# Patient Record
Sex: Female | Born: 1995 | Race: White | Hispanic: No | Marital: Single | Smoking: Never smoker
Health system: Southern US, Community
[De-identification: ages and names within clinical notes are randomized; demographics above are authoritative.]

## PROBLEM LIST (undated history)

## (undated) DIAGNOSIS — K8020 Calculus of gallbladder without cholecystitis without obstruction: Secondary | ICD-10-CM

## (undated) DIAGNOSIS — F419 Anxiety disorder, unspecified: Secondary | ICD-10-CM

## (undated) DIAGNOSIS — F988 Other specified behavioral and emotional disorders with onset usually occurring in childhood and adolescence: Secondary | ICD-10-CM

## (undated) DIAGNOSIS — E119 Type 2 diabetes mellitus without complications: Secondary | ICD-10-CM

## (undated) DIAGNOSIS — F32A Depression, unspecified: Secondary | ICD-10-CM

## (undated) DIAGNOSIS — Z8719 Personal history of other diseases of the digestive system: Secondary | ICD-10-CM

## (undated) DIAGNOSIS — K219 Gastro-esophageal reflux disease without esophagitis: Secondary | ICD-10-CM

## (undated) DIAGNOSIS — F329 Major depressive disorder, single episode, unspecified: Secondary | ICD-10-CM

## (undated) HISTORY — DX: Gastro-esophageal reflux disease without esophagitis: K21.9

---
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
## 2014-01-29 ENCOUNTER — Ambulatory Visit (INDEPENDENT_AMBULATORY_CARE_PROVIDER_SITE_OTHER): Payer: Self-pay | Admitting: General Surgery

## 2014-01-29 NOTE — H&P (Signed)
History of Present Illness Denise Trevino(Denise Heberlein MD; 01/29/2014 4:42 PM) The patient is a 19 year old female who presents for evaluation of gall stones. Patient is a 19 year old female who previously mincing secondary to choledocholithiasis. Patient comes in today secondary to continued abdominal pain. She was recently seen at [facility] with continued right upper quadrant pain after eating [employer] and hot sauce. Patient was previously scheduled but however secondary to monetary issues could not proceed with surgery. Patient does have gallstones on ultrasound   Allergies Denise Trevino(Denise Dollard, RN, BSN; 01/29/2014 4:08 PM) No Known Drug Allergies01/29/2016  Medication History Denise Trevino(Denise Dollard, RN, BSN; 01/29/2014 4:10 PM) Vitamin D (400UNIT Capsule, 1 (one) Oral daily) Active. PriLOSEC (20MG  Capsule DR, Oral daily) Active. Zofran ODT (4MG  Tablet Disperse, Oral as needed) Active. Oxycodone-Acetaminophen (2.5-325MG  Tablet, Oral as needed) Active. Multiple Vitamin (1 (one) Oral daily) Active.  Review of Systems Denise Trevino(Denise Stauber MD; 01/29/2014 4:41 PM) General Present- Appetite Loss, Chills, Fatigue, Night Sweats and Weight Gain. Not Present- Fever and Weight Loss. Skin Not Present- Change in Wart/Mole, Dryness, Hives, Jaundice, New Lesions, Non-Healing Wounds, Rash and Ulcer. HEENT Present- Nose Bleed, Seasonal Allergies, Visual Disturbances and Wears glasses/contact lenses. Not Present- Earache, Hearing Loss, Hoarseness, Oral Ulcers, Ringing in the Ears, Sinus Pain, Sore Throat and Yellow Eyes. Breast Not Present- Breast Mass, Breast Pain, Nipple Discharge and Skin Changes. Cardiovascular Present- Palpitations. Not Present- Chest Pain, Difficulty Breathing Lying Down, Leg Cramps, Rapid Heart Rate, Shortness of Breath and Swelling of Extremities. Gastrointestinal Present- Abdominal Pain, Bloating, Excessive gas, Gets full quickly at meals, Hemorrhoids, Indigestion, Nausea and Vomiting. Not Present-  Bloody Stool, Change in Bowel Habits, Chronic diarrhea, Constipation, Difficulty Swallowing and Rectal Pain. Female Genitourinary Present- Frequency and Urgency. Not Present- Nocturia, Painful Urination and Pelvic Pain. Musculoskeletal Present- Back Pain. Not Present- Joint Pain, Joint Stiffness, Muscle Pain, Muscle Weakness and Swelling of Extremities. Neurological Present- Numbness. Not Present- Decreased Memory, Fainting, Headaches, Seizures, Tingling, Tremor, Trouble walking and Weakness. Psychiatric Present- Depression. Not Present- Anxiety, Bipolar, Change in Sleep Pattern, Fearful and Frequent crying. Endocrine Present- Hot flashes. Not Present- Cold Intolerance, Excessive Hunger, Hair Changes, Heat Intolerance and New Diabetes.   Vitals Glass blower/designer(Denise Dollard RN, BSN; 01/29/2014 4:12 PM) 01/29/2014 4:10 PM Weight: 295 lb Height: 64in Body Surface Area: 2.46 m Body Mass Index: 50.64 kg/m Temp.: 99.38F(Oral)  Pulse: 92 (Regular)  Resp.: 18 (Unlabored)  P.OX: 98% (Room air) BP: 130/90 (Sitting, Left Arm, Standard)    Physical Exam Denise Trevino(Denise Picazo MD; 01/29/2014 4:41 PM) General Mental Status-Alert. General Appearance-Consistent with stated age. Hydration-Well hydrated. Voice-Normal.  Head and Neck Head-normocephalic, atraumatic with no lesions or palpable masses.  Chest and Lung Exam Chest and lung exam reveals -quiet, even and easy respiratory effort with no use of accessory muscles and on auscultation, normal breath sounds, no adventitious sounds and normal vocal resonance. Inspection Chest Wall - Normal. Back - normal.  Cardiovascular Cardiovascular examination reveals -on palpation PMI is normal in location and amplitude, no palpable S3 or S4. Normal cardiac borders., normal heart sounds, regular rate and rhythm with no murmurs, carotid auscultation reveals no bruits and normal pedal pulses bilaterally.  Abdomen Inspection Normal Exam - No Hernias.  Skin - Scar - no surgical scars. Palpation/Percussion Normal exam - Soft, Non Tender, No Rebound tenderness, No Rigidity (guarding) and No hepatosplenomegaly. Auscultation Normal exam - Bowel sounds normal.  Neurologic Neurologic evaluation reveals -alert and oriented x 3 with no impairment of recent or remote memory. Mental Status-Normal.  Musculoskeletal Normal Exam - Left-Upper Extremity Strength Normal and Lower Extremity Strength Normal. Normal Exam - Right-Upper Extremity Strength Normal, Lower Extremity Weakness.    Assessment & Plan Denise Trevino(Denise Vejar MD; 01/29/2014 4:43 PM) SYMPTOMATIC CHOLELITHIASIS (574.20  K80.20) Impression: 19 year old female sent to gallstones  1. Will proceed to the operative for a laparoscopic cholecystectomy 2. Risks and benefits were discussed with the patient to generally include, but not limited to: infection, bleeding, possible need for post op ERCP, damage to the bile ducts, bile leak, and possible need for further surgery. Alternatives were offered and described. All questions were answered and the patient voiced understanding of the procedure and wishes to proceed at this point with a laparoscopic cholecystectomy

## 2014-02-08 ENCOUNTER — Ambulatory Visit (INDEPENDENT_AMBULATORY_CARE_PROVIDER_SITE_OTHER): Payer: Self-pay | Admitting: General Surgery

## 2014-02-15 ENCOUNTER — Inpatient Hospital Stay (HOSPITAL_COMMUNITY): Admission: RE | Admit: 2014-02-15 | Payer: Self-pay | Source: Ambulatory Visit

## 2014-02-22 ENCOUNTER — Encounter (HOSPITAL_COMMUNITY)
Admission: RE | Admit: 2014-02-22 | Discharge: 2014-02-22 | Disposition: A | Discharge status: Home / Self Care | Payer: 59 | Source: Ambulatory Visit | Attending: General Surgery | Admitting: General Surgery

## 2014-02-22 ENCOUNTER — Encounter (HOSPITAL_COMMUNITY): Payer: Self-pay

## 2014-02-22 DIAGNOSIS — Z01812 Encounter for preprocedural laboratory examination: Secondary | ICD-10-CM | POA: Diagnosis present

## 2014-02-22 DIAGNOSIS — K8020 Calculus of gallbladder without cholecystitis without obstruction: Secondary | ICD-10-CM | POA: Diagnosis not present

## 2014-02-22 HISTORY — DX: Type 2 diabetes mellitus without complications: E11.9

## 2014-02-22 HISTORY — DX: Depression, unspecified: F32.A

## 2014-02-22 HISTORY — DX: Major depressive disorder, single episode, unspecified: F32.9

## 2014-02-22 HISTORY — DX: Other specified behavioral and emotional disorders with onset usually occurring in childhood and adolescence: F98.8

## 2014-02-22 HISTORY — DX: Anxiety disorder, unspecified: F41.9

## 2014-02-22 HISTORY — DX: Personal history of other diseases of the digestive system: Z87.19

## 2014-02-22 LAB — BASIC METABOLIC PANEL
ANION GAP: 10 (ref 5–15)
BUN: 10 mg/dL (ref 6–23)
CALCIUM: 9.7 mg/dL (ref 8.4–10.5)
CO2: 25 mmol/L (ref 19–32)
CREATININE: 0.68 mg/dL (ref 0.50–1.10)
Chloride: 105 mmol/L (ref 96–112)
GFR calc Af Amer: >90 mL/min (ref >90)
GFR calc non Af Amer: >90 mL/min (ref >90)
GLUCOSE: 121 mg/dL — AB (ref 70–99)
Potassium: 3.8 mmol/L (ref 3.5–5.1)
SODIUM: 140 mmol/L (ref 135–145)

## 2014-02-22 LAB — CBC
HCT: 41.6 % (ref 36.0–46.0)
HEMOGLOBIN: 14.2 g/dL (ref 12.0–15.0)
MCH: 29 pg (ref 26.0–34.0)
MCHC: 34.1 g/dL (ref 30.0–36.0)
MCV: 85.1 fL (ref 78.0–100.0)
Platelets: 226 10*3/uL (ref 150–400)
RBC: 4.89 MIL/uL (ref 3.87–5.11)
RDW: 13 % (ref 11.5–15.5)
WBC: 5.9 10*3/uL (ref 4.0–10.5)

## 2014-02-22 LAB — HCG, SERUM, QUALITATIVE: Preg, Serum: NEGATIVE

## 2014-02-22 NOTE — Pre-Procedure Instructions (Addendum)
Erskine Speedessa Mehan  02/22/2014   Your procedure is scheduled on:  Wednesday, March 24.                 [facility] at 10:15AM.  Call this number if you have problems the morning of surgery: [phone]                   Remember:   Do not eat food or drink liquids after midnight Tuesday.   Take these medicines the morning of surgery with A SIP OF WATER: Take if needed: acetaminophen (TYLENOL), or oxycodone                Stop taking Vitamins.   Do not wear jewelry, make-up or nail polish.  Do not wear lotions, powders, or perfumes.  Do not shave 48 hours prior to surgery.  Do not bring valuables to the hospital.              [facility] is not responsible for any belongings or valuables.               Contacts, dentures or bridgework may not be worn into surgery.  Leave suitcase in the car. After surgery it may be brought to your room.  For patients admitted to the hospital, discharge time is determined by yourtreatment team.               Patients discharged the day of surgery will not be allowed to drive home.  Name and phone number of your driver:   Special Instructions: Review  [place] - Preparing For Surgery.   Please read over the following fact sheets that you were given: Pain Booklet, Coughing and Deep Breathing and Surgical Site Infection Prevention

## 2014-02-23 MED ORDER — CHLORHEXIDINE GLUCONATE 4 % EX LIQD
1.0000 "application " | Freq: Once | CUTANEOUS | Status: DC
  Filled 2014-02-23: qty 15

## 2014-02-23 MED ORDER — DEXTROSE 5 % IV SOLN
3.0000 g | INTRAVENOUS | Status: AC
  Administered 2014-02-24: 3 g via INTRAVENOUS
  Filled 2014-02-23 (×2): qty 3000

## 2014-02-24 ENCOUNTER — Encounter (HOSPITAL_COMMUNITY): Payer: Self-pay | Admitting: *Deleted

## 2014-02-24 ENCOUNTER — Ambulatory Visit (HOSPITAL_COMMUNITY)
Admission: RE | Admit: 2014-02-24 | Discharge: 2014-02-24 | Disposition: A | Discharge status: Home / Self Care | Payer: 59 | Source: Ambulatory Visit | Attending: General Surgery | Admitting: General Surgery

## 2014-02-24 ENCOUNTER — Ambulatory Visit (HOSPITAL_COMMUNITY): Payer: 59 | Admitting: Anesthesiology

## 2014-02-24 ENCOUNTER — Encounter (HOSPITAL_COMMUNITY)
Admission: RE | Disposition: A | Discharge status: Home / Self Care | Payer: Self-pay | Source: Ambulatory Visit | Attending: General Surgery

## 2014-02-24 DIAGNOSIS — K8020 Calculus of gallbladder without cholecystitis without obstruction: Secondary | ICD-10-CM | POA: Diagnosis present

## 2014-02-24 DIAGNOSIS — K219 Gastro-esophageal reflux disease without esophagitis: Secondary | ICD-10-CM | POA: Diagnosis not present

## 2014-02-24 DIAGNOSIS — K449 Diaphragmatic hernia without obstruction or gangrene: Secondary | ICD-10-CM | POA: Insufficient documentation

## 2014-02-24 DIAGNOSIS — K8010 Calculus of gallbladder with chronic cholecystitis without obstruction: Secondary | ICD-10-CM | POA: Diagnosis not present

## 2014-02-24 DIAGNOSIS — E119 Type 2 diabetes mellitus without complications: Secondary | ICD-10-CM | POA: Diagnosis not present

## 2014-02-24 HISTORY — PX: CHOLECYSTECTOMY: SHX55

## 2014-02-24 LAB — GLUCOSE, CAPILLARY: Glucose-Capillary: 97 mg/dL (ref 70–99)

## 2014-02-24 SURGERY — LAPAROSCOPIC CHOLECYSTECTOMY
Anesthesia: General | Site: Abdomen

## 2014-02-24 MED ORDER — SODIUM CHLORIDE 0.9 % IR SOLN
Status: DC | PRN
  Administered 2014-02-24: 1000 mL

## 2014-02-24 MED ORDER — BUPIVACAINE HCL 0.25 % IJ SOLN
INTRAMUSCULAR | Status: DC | PRN
  Administered 2014-02-24: 8 mL

## 2014-02-24 MED ORDER — GLYCOPYRROLATE 0.2 MG/ML IJ SOLN
INTRAMUSCULAR | Status: AC
  Filled 2014-02-24: qty 2

## 2014-02-24 MED ORDER — ROCURONIUM BROMIDE 50 MG/5ML IV SOLN
INTRAVENOUS | Status: AC
  Filled 2014-02-24: qty 1

## 2014-02-24 MED ORDER — ARTIFICIAL TEARS OP OINT
TOPICAL_OINTMENT | OPHTHALMIC | Status: AC
  Filled 2014-02-24: qty 3.5

## 2014-02-24 MED ORDER — ACETAMINOPHEN 10 MG/ML IV SOLN
INTRAVENOUS | Status: DC | PRN
  Administered 2014-02-24: 1000 mg via INTRAVENOUS

## 2014-02-24 MED ORDER — ONDANSETRON HCL 4 MG/2ML IJ SOLN
INTRAMUSCULAR | Status: DC | PRN
  Administered 2014-02-24: 4 mg via INTRAVENOUS

## 2014-02-24 MED ORDER — FENTANYL CITRATE 0.05 MG/ML IJ SOLN
INTRAMUSCULAR | Status: AC
  Filled 2014-02-24: qty 2

## 2014-02-24 MED ORDER — ACETAMINOPHEN 160 MG/5ML PO SOLN
325.0000 mg | ORAL | Status: DC | PRN
  Filled 2014-02-24: qty 20.3

## 2014-02-24 MED ORDER — OXYCODONE HCL 5 MG PO TABS: 5.0000 mg | ORAL_TABLET | ORAL | Status: DC | PRN

## 2014-02-24 MED ORDER — ACETAMINOPHEN 325 MG PO TABS
650.0000 mg | ORAL_TABLET | ORAL | Status: DC | PRN
  Filled 2014-02-24: qty 2

## 2014-02-24 MED ORDER — ROCURONIUM BROMIDE 100 MG/10ML IV SOLN
INTRAVENOUS | Status: DC | PRN
  Administered 2014-02-24: 40 mg via INTRAVENOUS

## 2014-02-24 MED ORDER — ONDANSETRON HCL 4 MG/2ML IJ SOLN
INTRAMUSCULAR | Status: AC
  Filled 2014-02-24: qty 2

## 2014-02-24 MED ORDER — FENTANYL CITRATE 0.05 MG/ML IJ SOLN
INTRAMUSCULAR | Status: AC
  Filled 2014-02-24: qty 5

## 2014-02-24 MED ORDER — ACETAMINOPHEN 650 MG RE SUPP
650.0000 mg | RECTAL | Status: DC | PRN
  Filled 2014-02-24: qty 1

## 2014-02-24 MED ORDER — FENTANYL CITRATE 0.05 MG/ML IJ SOLN
INTRAMUSCULAR | Status: DC | PRN
  Administered 2014-02-24: 100 ug via INTRAVENOUS
  Administered 2014-02-24 (×3): 50 ug via INTRAVENOUS

## 2014-02-24 MED ORDER — GLYCOPYRROLATE 0.2 MG/ML IJ SOLN
INTRAMUSCULAR | Status: AC
  Filled 2014-02-24: qty 5

## 2014-02-24 MED ORDER — GLYCOPYRROLATE 0.2 MG/ML IJ SOLN
INTRAMUSCULAR | Status: DC | PRN
  Administered 2014-02-24: .8 mg via INTRAVENOUS

## 2014-02-24 MED ORDER — ACETAMINOPHEN 10 MG/ML IV SOLN
INTRAVENOUS | Status: AC
  Filled 2014-02-24: qty 100

## 2014-02-24 MED ORDER — NEOSTIGMINE METHYLSULFATE 10 MG/10ML IV SOLN
INTRAVENOUS | Status: DC | PRN
  Administered 2014-02-24: 5 mg via INTRAVENOUS

## 2014-02-24 MED ORDER — NEOSTIGMINE METHYLSULFATE 10 MG/10ML IV SOLN
INTRAVENOUS | Status: AC
  Filled 2014-02-24: qty 1

## 2014-02-24 MED ORDER — PROPOFOL 10 MG/ML IV BOLUS
INTRAVENOUS | Status: AC
  Filled 2014-02-24: qty 20

## 2014-02-24 MED ORDER — DEXAMETHASONE SODIUM PHOSPHATE 4 MG/ML IJ SOLN
INTRAMUSCULAR | Status: DC | PRN
Start: 2014-02-24 — End: 2014-02-24
  Administered 2014-02-24: 8 mg via INTRAVENOUS

## 2014-02-24 MED ORDER — LACTATED RINGERS IV SOLN
INTRAVENOUS | Status: DC | PRN
  Administered 2014-02-24 (×2): via INTRAVENOUS

## 2014-02-24 MED ORDER — ACETAMINOPHEN 325 MG PO TABS: 325.0000 mg | ORAL_TABLET | ORAL | Status: DC | PRN

## 2014-02-24 MED ORDER — 0.9 % SODIUM CHLORIDE (POUR BTL) OPTIME
TOPICAL | Status: DC | PRN
  Administered 2014-02-24: 1000 mL

## 2014-02-24 MED ORDER — MIDAZOLAM HCL 2 MG/2ML IJ SOLN
INTRAMUSCULAR | Status: AC
  Filled 2014-02-24: qty 2

## 2014-02-24 MED ORDER — LIDOCAINE HCL (CARDIAC) 20 MG/ML IV SOLN
INTRAVENOUS | Status: AC
  Filled 2014-02-24: qty 5

## 2014-02-24 MED ORDER — LIDOCAINE HCL (CARDIAC) 20 MG/ML IV SOLN
INTRAVENOUS | Status: DC | PRN
  Administered 2014-02-24: 100 mg via INTRAVENOUS

## 2014-02-24 MED ORDER — OXYCODONE HCL 5 MG/5ML PO SOLN
5.0000 mg | Freq: Once | ORAL | Status: AC | PRN
Start: 2014-02-24 — End: 2014-02-24

## 2014-02-24 MED ORDER — OXYCODONE-ACETAMINOPHEN 7.5-325 MG PO TABS
1.0000 | ORAL_TABLET | ORAL | Status: AC | PRN
Start: 2014-02-24 — End: ?

## 2014-02-24 MED ORDER — LACTATED RINGERS IV SOLN
INTRAVENOUS | Status: DC
  Administered 2014-02-24: 11:00:00 via INTRAVENOUS

## 2014-02-24 MED ORDER — MIDAZOLAM HCL 5 MG/5ML IJ SOLN
INTRAMUSCULAR | Status: DC | PRN
  Administered 2014-02-24: 2 mg via INTRAVENOUS

## 2014-02-24 MED ORDER — PROPOFOL 10 MG/ML IV BOLUS
INTRAVENOUS | Status: DC | PRN
  Administered 2014-02-24: 200 mg via INTRAVENOUS

## 2014-02-24 MED ORDER — OXYCODONE HCL 5 MG PO TABS
5.0000 mg | ORAL_TABLET | Freq: Once | ORAL | Status: AC | PRN
  Administered 2014-02-24: 5 mg via ORAL

## 2014-02-24 MED ORDER — FENTANYL CITRATE 0.05 MG/ML IJ SOLN
25.0000 ug | INTRAMUSCULAR | Status: DC | PRN
  Administered 2014-02-24 (×3): 50 ug via INTRAVENOUS

## 2014-02-24 SURGICAL SUPPLY — 51 items
APL SKNCLS STERI-STRIP NONHPOA (GAUZE/BANDAGES/DRESSINGS) ×1
BAG SPEC RTRVL LRG 6X4 10 (ENDOMECHANICALS)
BENZOIN TINCTURE PRP APPL 2/3 (GAUZE/BANDAGES/DRESSINGS) ×3 IMPLANT
CANISTER SUCTION 2500CC (MISCELLANEOUS) ×3 IMPLANT
CHLORAPREP W/TINT 26ML (MISCELLANEOUS) ×3 IMPLANT
CLIP LIGATING HEMO O LOK GREEN (MISCELLANEOUS) ×3 IMPLANT
CLOSURE WOUND 1/2 X4 (GAUZE/BANDAGES/DRESSINGS) ×1
COVER MAYO STAND STRL (DRAPES) IMPLANT
COVER SURGICAL LIGHT HANDLE (MISCELLANEOUS) ×3 IMPLANT
COVER TRANSDUCER ULTRASND (DRAPES) ×3 IMPLANT
DEVICE TROCAR PUNCTURE CLOSURE (ENDOMECHANICALS) ×3 IMPLANT
DRAPE C-ARM 42X72 X-RAY (DRAPES) IMPLANT
DRAPE LAPAROSCOPIC ABDOMINAL (DRAPES) ×3 IMPLANT
ELECT REM PT RETURN 9FT ADLT (ELECTROSURGICAL) ×3
ELECTRODE REM PT RTRN 9FT ADLT (ELECTROSURGICAL) ×1 IMPLANT
GAUZE SPONGE 2X2 8PLY STRL LF (GAUZE/BANDAGES/DRESSINGS) ×1 IMPLANT
GLOVE BIO SURGEON STRL SZ7 (GLOVE) ×4 IMPLANT
GLOVE BIO SURGEON STRL SZ7.5 (GLOVE) ×7 IMPLANT
GLOVE BIOGEL PI IND STRL 6.5 (GLOVE) IMPLANT
GLOVE BIOGEL PI IND STRL 7.0 (GLOVE) IMPLANT
GLOVE BIOGEL PI IND STRL 7.5 (GLOVE) IMPLANT
GLOVE BIOGEL PI INDICATOR 6.5 (GLOVE) ×2
GLOVE BIOGEL PI INDICATOR 7.0 (GLOVE) ×2
GLOVE BIOGEL PI INDICATOR 7.5 (GLOVE) ×2
GOWN STRL REUS W/ TWL LRG LVL3 (GOWN DISPOSABLE) ×2 IMPLANT
GOWN STRL REUS W/ TWL XL LVL3 (GOWN DISPOSABLE) ×1 IMPLANT
GOWN STRL REUS W/TWL LRG LVL3 (GOWN DISPOSABLE) ×6
GOWN STRL REUS W/TWL XL LVL3 (GOWN DISPOSABLE) ×3
IV CATH 14GX2 1/4 (CATHETERS) IMPLANT
KIT BASIN OR (CUSTOM PROCEDURE TRAY) ×3 IMPLANT
KIT ROOM TURNOVER OR (KITS) ×3 IMPLANT
NDL INSUFFLATION 14GA 120MM (NEEDLE) ×1 IMPLANT
NEEDLE INSUFFLATION 14GA 120MM (NEEDLE) ×3 IMPLANT
NS IRRIG 1000ML POUR BTL (IV SOLUTION) ×3 IMPLANT
PAD ARMBOARD 7.5X6 YLW CONV (MISCELLANEOUS) ×6 IMPLANT
POUCH SPECIMEN RETRIEVAL 10MM (ENDOMECHANICALS) IMPLANT
SCISSORS LAP 5X35 DISP (ENDOMECHANICALS) ×3 IMPLANT
SET CHOLANGIOGRAPHY FRANKLIN (SET/KITS/TRAYS/PACK) IMPLANT
SET IRRIG TUBING LAPAROSCOPIC (IRRIGATION / IRRIGATOR) ×3 IMPLANT
SLEEVE ENDOPATH XCEL 5M (ENDOMECHANICALS) ×3 IMPLANT
SPECIMEN JAR SMALL (MISCELLANEOUS) ×3 IMPLANT
SPONGE GAUZE 2X2 STER 10/PKG (GAUZE/BANDAGES/DRESSINGS) ×2
STRIP CLOSURE SKIN 1/2X4 (GAUZE/BANDAGES/DRESSINGS) ×1 IMPLANT
SUT MNCRL AB 3-0 PS2 18 (SUTURE) ×3 IMPLANT
TAPE CLOTH SOFT 2X10 (GAUZE/BANDAGES/DRESSINGS) ×2 IMPLANT
TOWEL OR 17X24 6PK STRL BLUE (TOWEL DISPOSABLE) ×1 IMPLANT
TOWEL OR 17X26 10 PK STRL BLUE (TOWEL DISPOSABLE) ×3 IMPLANT
TRAY LAPAROSCOPIC (CUSTOM PROCEDURE TRAY) ×3 IMPLANT
TROCAR XCEL NON-BLD 11X100MML (ENDOMECHANICALS) ×3 IMPLANT
TROCAR XCEL NON-BLD 5MMX100MML (ENDOMECHANICALS) ×3 IMPLANT
TUBING INSUFFLATION (TUBING) ×3 IMPLANT

## 2014-02-24 NOTE — H&P (View-Only) (Signed)
History of Present Illness Denise Trevino(Denise Kitchings MD; 01/29/2014 4:42 PM) The patient is a 19 year old female who presents for evaluation of gall stones. Patient is a 19 year old female who previously mincing secondary to choledocholithiasis. Patient comes in today secondary to continued abdominal pain. She was recently seen at [facility] with continued right upper quadrant pain after eating [employer] and hot sauce. Patient was previously scheduled but however secondary to monetary issues could not proceed with surgery. Patient does have gallstones on ultrasound   Allergies [name](Denise Dollard, RN, BSN; 01/29/2014 4:08 PM) No Known Drug Allergies01/29/2016  Medication History [name](Denise Dollard, RN, BSN; 01/29/2014 4:10 PM) Vitamin D (400UNIT Capsule, 1 (one) Oral daily) Active. PriLOSEC (20MG  Capsule DR, Oral daily) Active. Zofran ODT (4MG  Tablet Disperse, Oral as needed) Active. Oxycodone-Acetaminophen (2.5-325MG  Tablet, Oral as needed) Active. Multiple Vitamin (1 (one) Oral daily) Active.  Review of Systems Denise Trevino(Denise Redlich MD; 01/29/2014 4:41 PM) General Present- Appetite Loss, Chills, Fatigue, Night Sweats and Weight Gain. Not Present- Fever and Weight Loss. Skin Not Present- Change in Wart/Mole, Dryness, Hives, Jaundice, New Lesions, Non-Healing Wounds, Rash and Ulcer. HEENT Present- Nose Bleed, Seasonal Allergies, Visual Disturbances and Wears glasses/contact lenses. Not Present- Earache, Hearing Loss, Hoarseness, Oral Ulcers, Ringing in the Ears, Sinus Pain, Sore Throat and Yellow Eyes. Breast Not Present- Breast Mass, Breast Pain, Nipple Discharge and Skin Changes. Cardiovascular Present- Palpitations. Not Present- Chest Pain, Difficulty Breathing Lying Down, Leg Cramps, Rapid Heart Rate, Shortness of Breath and Swelling of Extremities. Gastrointestinal Present- Abdominal Pain, Bloating, Excessive gas, Gets full quickly at meals, Hemorrhoids, Indigestion, Nausea and Vomiting. Not Present-  Bloody Stool, Change in Bowel Habits, Chronic diarrhea, Constipation, Difficulty Swallowing and Rectal Pain. Female Genitourinary Present- Frequency and Urgency. Not Present- Nocturia, Painful Urination and Pelvic Pain. Musculoskeletal Present- Back Pain. Not Present- Joint Pain, Joint Stiffness, Muscle Pain, Muscle Weakness and Swelling of Extremities. Neurological Present- Numbness. Not Present- Decreased Memory, Fainting, Headaches, Seizures, Tingling, Tremor, Trouble walking and Weakness. Psychiatric Present- Depression. Not Present- Anxiety, Bipolar, Change in Sleep Pattern, Fearful and Frequent crying. Endocrine Present- Hot flashes. Not Present- Cold Intolerance, Excessive Hunger, Hair Changes, Heat Intolerance and New Diabetes.   Vitals Glass blower/designer(Denise Dollard RN, BSN; 01/29/2014 4:12 PM) 01/29/2014 4:10 PM Weight: 295 lb Height: 64in Body Surface Area: 2.46 m Body Mass Index: 50.64 kg/m Temp.: 99.38F(Oral)  Pulse: 92 (Regular)  Resp.: 18 (Unlabored)  P.OX: 98% (Room air) BP: 130/90 (Sitting, Left Arm, Standard)    Physical Exam Denise Trevino([name] MD; 01/29/2014 4:41 PM) General Mental Status-Alert. General Appearance-Consistent with stated age. Hydration-Well hydrated. Voice-Normal.  Head and Neck Head-normocephalic, atraumatic with no lesions or palpable masses.  Chest and Lung Exam Chest and lung exam reveals -quiet, even and easy respiratory effort with no use of accessory muscles and on auscultation, normal breath sounds, no adventitious sounds and normal vocal resonance. Inspection Chest Wall - Normal. Back - normal.  Cardiovascular Cardiovascular examination reveals -on palpation PMI is normal in location and amplitude, no palpable S3 or S4. Normal cardiac borders., normal heart sounds, regular rate and rhythm with no murmurs, carotid auscultation reveals no bruits and normal pedal pulses bilaterally.  Abdomen Inspection Normal Exam - No Hernias.  Skin - Scar - no surgical scars. Palpation/Percussion Normal exam - Soft, Non Tender, No Rebound tenderness, No Rigidity (guarding) and No hepatosplenomegaly. Auscultation Normal exam - Bowel sounds normal.  Neurologic Neurologic evaluation reveals -alert and oriented x 3 with no impairment of recent or remote memory. Mental Status-Normal.  Musculoskeletal Normal Exam - Left-Upper Extremity Strength Normal and Lower Extremity Strength Normal. Normal Exam - Right-Upper Extremity Strength Normal, Lower Extremity Weakness.    Assessment & Plan Denise Trevino(Denise Kray MD; 01/29/2014 4:43 PM) SYMPTOMATIC CHOLELITHIASIS (574.20  K80.20) Impression: 19 year old female sent to gallstones  1. Will proceed to the operative for a laparoscopic cholecystectomy 2. Risks and benefits were discussed with the patient to generally include, but not limited to: infection, bleeding, possible need for post op ERCP, damage to the bile ducts, bile leak, and possible need for further surgery. Alternatives were offered and described. All questions were answered and the patient voiced understanding of the procedure and wishes to proceed at this point with a laparoscopic cholecystectomy

## 2014-02-24 NOTE — Anesthesia Preprocedure Evaluation (Signed)
Anesthesia Evaluation  Patient identified by MRN, date of birth, ID band Patient awake    Reviewed: Allergy & Precautions, NPO status , Patient's Chart, lab work & pertinent test results  History of Anesthesia Complications Negative for: history of anesthetic complications  Airway Mallampati: II  TM Distance: >3 FB Neck ROM: Full    Dental  (+) Teeth Intact   Pulmonary neg pulmonary ROS,  breath sounds clear to auscultation        Cardiovascular negative cardio ROS  Rhythm:Regular     Neuro/Psych PSYCHIATRIC DISORDERS Anxiety Depression negative neurological ROS     GI/Hepatic Neg liver ROS, hiatal hernia, GERD-  ,  Endo/Other  diabetesMorbid obesity  Renal/GU negative Renal ROS     Musculoskeletal   Abdominal   Peds  Hematology negative hematology ROS (+)   Anesthesia Other Findings   Reproductive/Obstetrics                             Anesthesia Physical Anesthesia Plan  ASA: II  Anesthesia Plan: General   Post-op Pain Management:    Induction: Intravenous  Airway Management Planned: Oral ETT  Additional Equipment: None  Intra-op Plan:   Post-operative Plan: Extubation in OR  Informed Consent: I have reviewed the patients History and Physical, chart, labs and discussed the procedure including the risks, benefits and alternatives for the proposed anesthesia with the patient or authorized representative who has indicated his/her understanding and acceptance.   Dental advisory given  Plan Discussed with: CRNA and Surgeon  Anesthesia Plan Comments:         Anesthesia Quick Evaluation

## 2014-02-24 NOTE — Transfer of Care (Signed)
Immediate Anesthesia Transfer of Care Note  Patient: Denise Trevino  Procedure(s) Performed: Procedure(s): LAPAROSCOPIC CHOLECYSTECTOMY (N/A)  Patient Location: PACU  Anesthesia Type:General  Level of Consciousness: awake, alert , oriented and patient cooperative  Airway & Oxygen Therapy: Patient Spontanous Breathing  Post-op Assessment: Report given to RN and Post -op Vital signs reviewed and stable  Post vital signs: Reviewed and stable  Last Vitals:  Filed Vitals:   02/24/14 1249  BP:   Pulse:   Temp: 36.9 C  Resp:     Complications: No apparent anesthesia complications

## 2014-02-24 NOTE — Anesthesia Postprocedure Evaluation (Signed)
  Anesthesia Post-op Note  Patient: Denise Trevino  Procedure(s) Performed: Procedure(s): LAPAROSCOPIC CHOLECYSTECTOMY (N/A)  Patient Location: PACU  Anesthesia Type:General  Level of Consciousness: awake  Airway and Oxygen Therapy: Patient Spontanous Breathing  Post-op Pain: mild  Post-op Assessment: Post-op Vital signs reviewed, Patient's Cardiovascular Status Stable, Respiratory Function Stable, Patent Airway, No signs of Nausea or vomiting and Pain level controlled  Post-op Vital Signs: Reviewed and stable  Last Vitals:  Filed Vitals:   02/24/14 1526  BP:   Pulse: 63  Temp:   Resp: 15    Complications: No apparent anesthesia complications

## 2014-02-24 NOTE — Progress Notes (Signed)
Report to Maryfrances BunnellLauren Reynolds RN as primary caregiver

## 2014-02-24 NOTE — Interval H&P Note (Signed)
History and Physical Interval Note:  02/24/2014 11:28 AM  Denise Trevino  has presented today for surgery, with the diagnosis of gallstones  The various methods of treatment have been discussed with the patient and family. After consideration of risks, benefits and other options for treatment, the patient has consented to  Procedure(s): LAPAROSCOPIC CHOLECYSTECTOMY (N/A) as a surgical intervention .  The patient's history has been reviewed, patient examined, no change in status, stable for surgery.  I have reviewed the patient's chart and labs.  Questions were answered to the patient's satisfaction.     Marigene Ehlersamirez Jr., Jed LimerickArmando

## 2014-02-24 NOTE — Op Note (Signed)
02/24/2014  12:34 PM  PATIENT:  Denise Trevino  19 y.o. female  PRE-OPERATIVE DIAGNOSIS:  GALLSTONES  POST-OPERATIVE DIAGNOSIS:  GALLSTONES  PROCEDURE:  Procedure(s): LAPAROSCOPIC CHOLECYSTECTOMY (N/A)  SURGEON:  Surgeon(s) and Role:    * Axel FillerArmando Suly Vukelich, MD - Primary  ASSISTANTS: Shirley FriarShelia Bowman, RNFA   ANESTHESIA:   local and general  EBL:  Total I/O In: 1000 [I.V.:1000] Out: -   BLOOD ADMINISTERED:none  DRAINS: none   LOCAL MEDICATIONS USED:  BUPIVICAINE   SPECIMEN:  Source of Specimen:  gallbladder  DISPOSITION OF SPECIMEN:  PATHOLOGY  COUNTS:  YES  TOURNIQUET:  * No tourniquets in log *  DICTATION: .Dragon Dictation  Findings:Chronically inflammed gallbladder and gallstones  Indications for procedure: Pt is a 19 y/o F with RUQ pain and seen to have gallstones.   Details of the procedure: The patient was taken to the operating and placed in the supine position with bilateral SCDs in place. A time out was called and all facts were verified. A pneumoperitoneum was obtained via A Veress needle technique to a pressure of 14mm of mercury. A 5mm trochar was then placed in the right upper quadrant under visualization, and there were no injuries to any abdominal organs. A 11 mm port was then placed in the umbilical region after infiltrating with local anesthesia under direct visualization. A second epigastric port was placed under direct visualization. The gallbladder was identified and retracted, the peritoneum was then sharply dissected from the gallbladder and this dissection was carried down to Calot's triangle. The cystic duct was identified and stripped away circumferentially and seen going into the gallbladder 360, the critical angle was obtained. It was noted to be very dilated and large. A Cook catheter was used to perform an intraoperative cholangiogram. The cystic duct and common bile duct were seen free of filling defects. 2 clips were placed proximally one distally  and the cystic duct transected. The cystic artery was identified and 2 clips placed proximally and one distally and transected. We then proceeded to remove the gallbladder off the hepatic fossa with Bovie cautery. A retrieval bag was then placed in the abdomen and gallbladder placed in the bag. The hepatic fossa was then reexamined and hemostasis was achieved with Bovie cautery and was excellent at this portion of the case. The subhepatic fossa and perihepatic fossa was then irrigated until the effluent was clear. The bag and specimen were extracted from the body.  The 11 mm trocar fascia was reapproximated with the Endo Close #1 Vicryl x2. The pneumoperitoneum was evacuated and all trochars removed under direct visulalization. The skin was then closed with 4-0 Monocryl and the skin dressed with Steri-Strips, gauze, and tape. The patient was awaken from general anesthesia and taken to the recovery room in stable condition.    PLAN OF CARE: Discharge to home after PACU  PATIENT DISPOSITION:  PACU - hemodynamically stable.   Delay start of Pharmacological VTE agent (>24hrs) due to surgical blood loss or risk of bleeding: not applicable

## 2014-02-24 NOTE — Discharge Instructions (Signed)
[facility] ______[facility] °LAPAROSCOPIC SURGERY: POST OP INSTRUCTIONS °Always review your discharge instruction sheet given to you by the facility where your surgery was performed. °IF YOU HAVE DISABILITY OR FAMILY LEAVE FORMS, YOU MUST BRING THEM TO THE OFFICE FOR PROCESSING.   °DO NOT GIVE THEM TO YOUR DOCTOR. ° °1. A prescription for pain medication may be given to you upon discharge.  Take your pain medication as prescribed, if needed.  If narcotic pain medicine is not needed, then you may take acetaminophen (Tylenol) or ibuprofen (Advil) as needed. °2. Take your usually prescribed medications unless otherwise directed. °3. If you need a refill on your pain medication, please contact your pharmacy.  They will contact our office to request authorization. Prescriptions will not be filled after 5pm or on week-ends. °4. You should follow a light diet the first few days after arrival home, such as soup and crackers, etc.  Be sure to include lots of fluids daily. °5. Most patients will experience some swelling and bruising in the area of the incisions.  Ice packs will help.  Swelling and bruising can take several days to resolve.  °6. It is common to experience some constipation if taking pain medication after surgery.  Increasing fluid intake and taking a stool softener (such as Colace) will usually help or prevent this problem from occurring.  A mild laxative (Milk of Magnesia or Miralax) should be taken according to package instructions if there are no bowel movements after 48 hours. °7. Unless discharge instructions indicate otherwise, you may remove your bandages 24-48 hours after surgery, and you may shower at that time.  You may have steri-strips (small skin tapes) in place directly over the incision.  These strips should be left on the skin for 7-10 days.  If your surgeon used skin glue on the incision, you may shower in 24 hours.  The glue will flake off over the next 2-3 weeks.  Any sutures or  staples will be removed at the office during your follow-up visit. °8. ACTIVITIES:  You may resume regular (light) daily activities beginning the next day--such as daily self-care, walking, climbing stairs--gradually increasing activities as tolerated.  You may have sexual intercourse when it is comfortable.  Refrain from any heavy lifting or straining until approved by your doctor. °a. You may drive when you are no longer taking prescription pain medication, you can comfortably wear a seatbelt, and you can safely maneuver your car and apply brakes. °b. RETURN TO WORK:  __________________________________________________________ °9. You should see your doctor in the office for a follow-up appointment approximately 2-3 weeks after your surgery.  Make sure that you call for this appointment within a day or two after you arrive home to insure a convenient appointment time. °10. OTHER INSTRUCTIONS: __________________________________________________________________________________________________________________________ __________________________________________________________________________________________________________________________ °WHEN TO CALL YOUR DOCTOR: °1. Fever over 101.0 °2. Inability to urinate °3. Continued bleeding from incision. °4. Increased pain, redness, or drainage from the incision. °5. Increasing abdominal pain ° °The clinic staff is available to answer your questions during regular business hours.  Please don’t hesitate to call and ask to speak to one of the nurses for clinical concerns.  If you have a medical emergency, go to the nearest emergency room or call 911.  A surgeon from [facility] is always on call at the hospital. °[address], [address], [place], [place]  [address] ? [address] [address], [place], [place]   [address] °[phone] ? [phone] ? FAX [phone] °Web site:   [email or web address] ° °General Anesthesia, Adult, Care After  °Refer to this  sheet in the next few weeks. These instructions provide you with information on caring for yourself after your procedure. Your health care provider may also give you more specific instructions. Your treatment has been planned according to current medical practices, but problems sometimes occur. Call your health care provider if you have any problems or questions after your procedure.  °WHAT TO EXPECT AFTER THE PROCEDURE  °After the procedure, it is typical to experience:  °Sleepiness.  °Nausea and vomiting. °HOME CARE INSTRUCTIONS  °For the first 24 hours after general anesthesia:  °Have a responsible person with you.  °Do not drive a car. If you are alone, do not take public transportation.  °Do not drink alcohol.  °Do not take medicine that has not been prescribed by your health care provider.  °Do not sign important papers or make important decisions.  °You may resume a normal diet and activities as directed by your health care provider.  °Change bandages (dressings) as directed.  °If you have questions or problems that seem related to general anesthesia, call the hospital and ask for the anesthetist or anesthesiologist on call. °SEEK MEDICAL CARE IF:  °You have nausea and vomiting that continue the day after anesthesia.  °You develop a rash. °SEEK IMMEDIATE MEDICAL CARE IF:  °You have difficulty breathing.  °You have chest pain.  °You have any allergic problems. °Document Released: 03/26/2000 Document Revised: 08/20/2012 Document Reviewed: 07/03/2012  °ExitCare® Patient Information ©2014 [employer].  ° ° °

## 2014-02-25 ENCOUNTER — Encounter (HOSPITAL_COMMUNITY): Payer: Self-pay | Admitting: General Surgery

## 2014-04-29 ENCOUNTER — Encounter (INDEPENDENT_AMBULATORY_CARE_PROVIDER_SITE_OTHER): Payer: Self-pay

## 2014-04-29 ENCOUNTER — Encounter (HOSPITAL_COMMUNITY): Payer: Self-pay | Admitting: Psychiatry

## 2014-04-29 ENCOUNTER — Ambulatory Visit (INDEPENDENT_AMBULATORY_CARE_PROVIDER_SITE_OTHER): Payer: 59 | Admitting: Psychiatry

## 2014-04-29 VITALS — BP 129/72 | HR 73 | Ht 63.75 in | Wt 294.4 lb

## 2014-04-29 DIAGNOSIS — F900 Attention-deficit hyperactivity disorder, predominantly inattentive type: Secondary | ICD-10-CM | POA: Diagnosis not present

## 2014-04-29 DIAGNOSIS — Z0000 Encounter for general adult medical examination without abnormal findings: Secondary | ICD-10-CM | POA: Diagnosis not present

## 2014-04-29 DIAGNOSIS — F330 Major depressive disorder, recurrent, mild: Secondary | ICD-10-CM | POA: Diagnosis not present

## 2014-04-29 DIAGNOSIS — F4010 Social phobia, unspecified: Secondary | ICD-10-CM | POA: Diagnosis not present

## 2014-04-29 MED ORDER — CITALOPRAM HYDROBROMIDE 20 MG PO TABS: 20.0000 mg | ORAL_TABLET | Freq: Every day | ORAL | Status: AC
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
## 2014-06-10 ENCOUNTER — Ambulatory Visit (HOSPITAL_COMMUNITY): Payer: Self-pay | Admitting: Psychiatry

## 2016-03-05 IMAGING — US US ABDOMEN LIMITED
1 series · 14 of 25 positions shown · non-contrast
Comparison: None.

CLINICAL DATA: Right upper quadrant pain

EXAM:
US ABDOMEN LIMITED - RIGHT UPPER QUADRANT

[Series 1: us abdomen limited · 0.29mm/px · 14 of 57 slices shown]
[im 1/57]
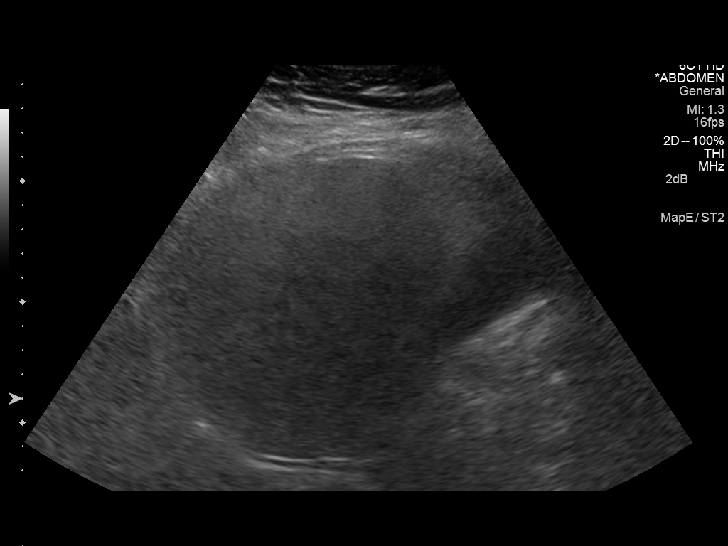
[im 5/57]
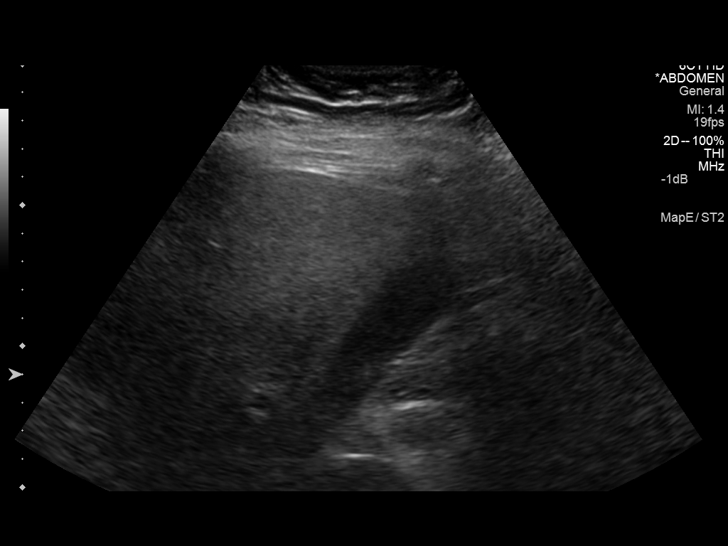
[im 10/57]
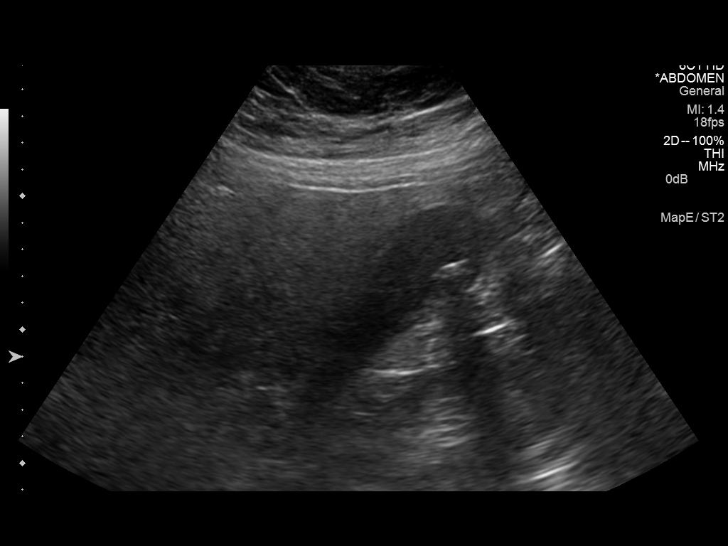
[im 15/57]
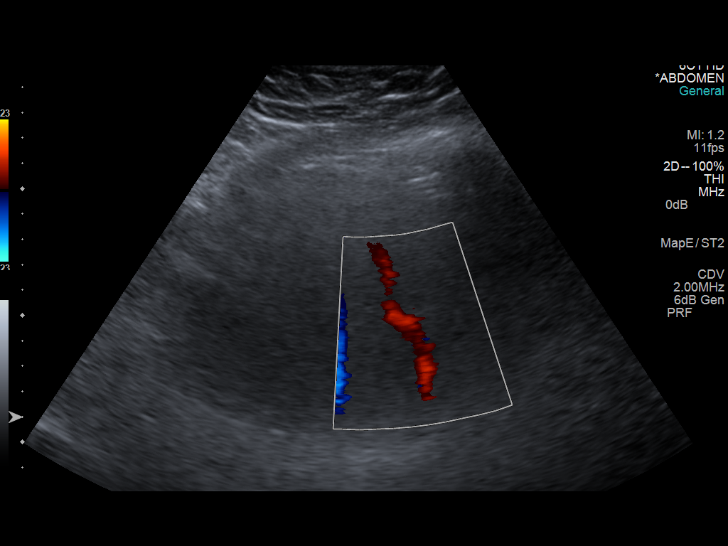
[im 19/57]
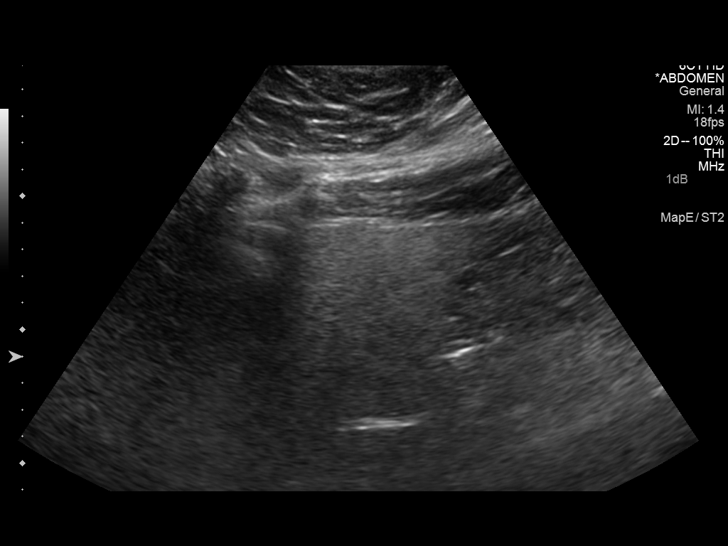
[im 22/57]
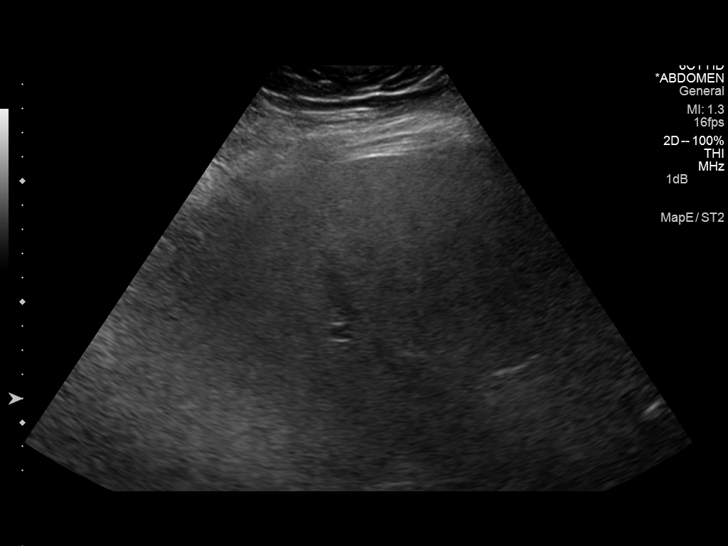
[im 26/57]
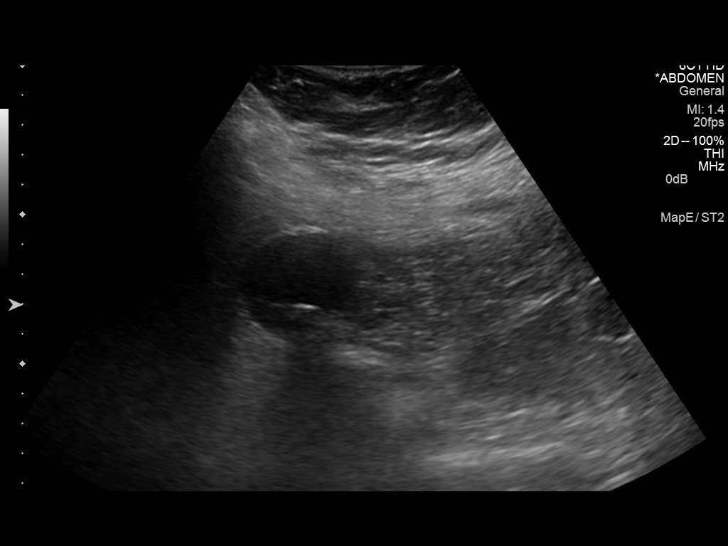
[im 31/57]
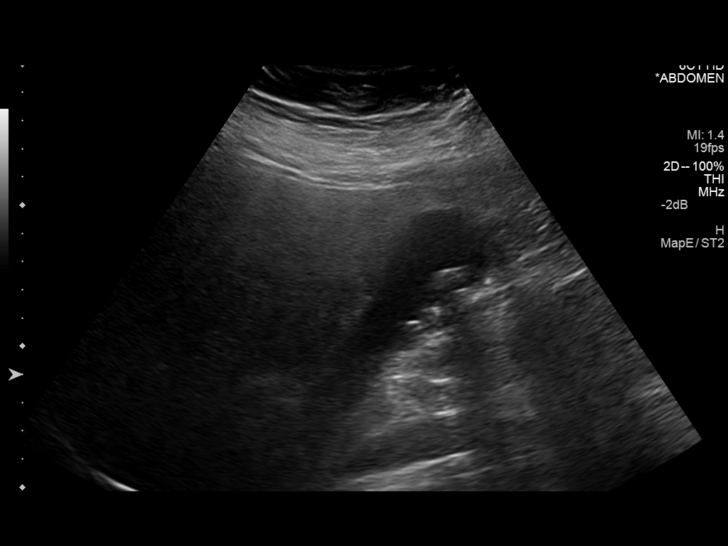
[im 36/57]
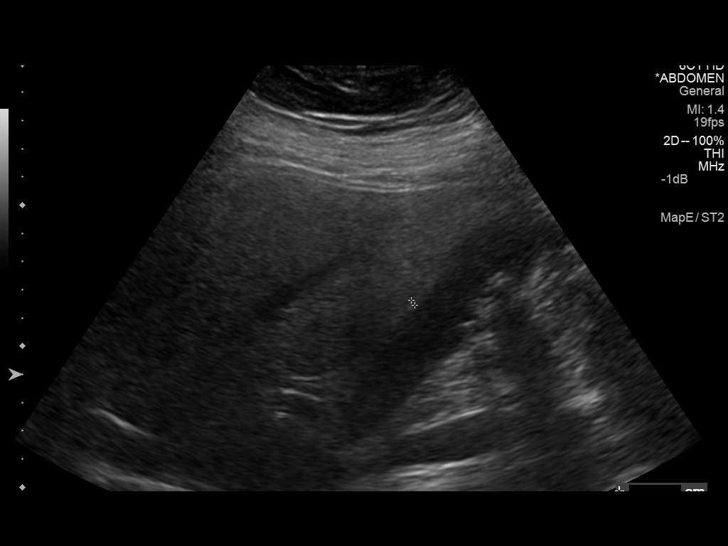
[im 38/57]
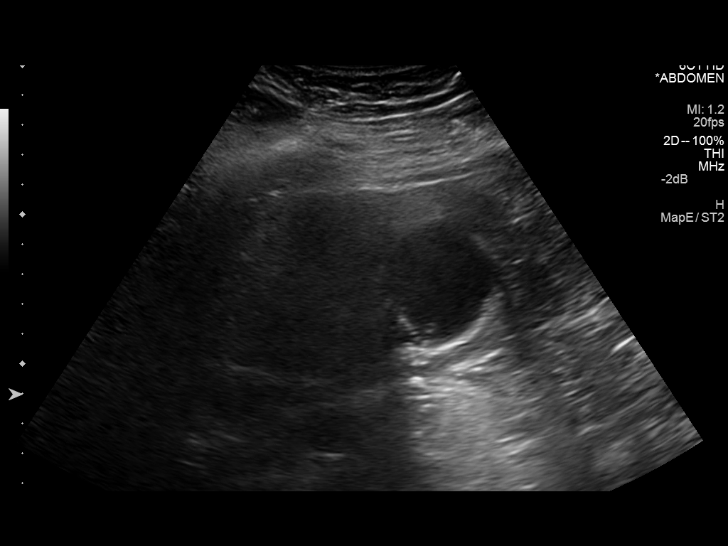
[im 43/57]
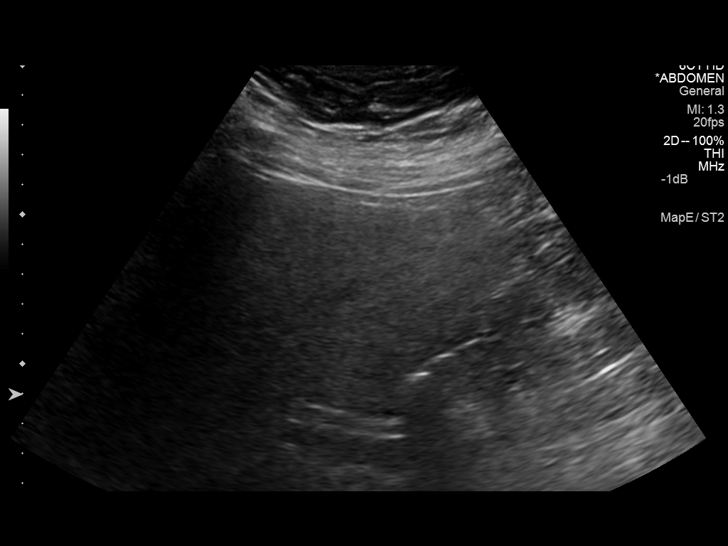
[im 47/57]
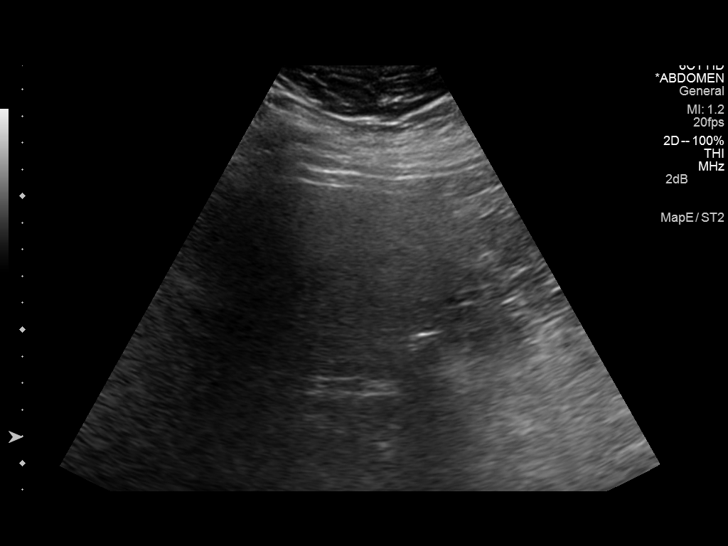
[im 52/57]
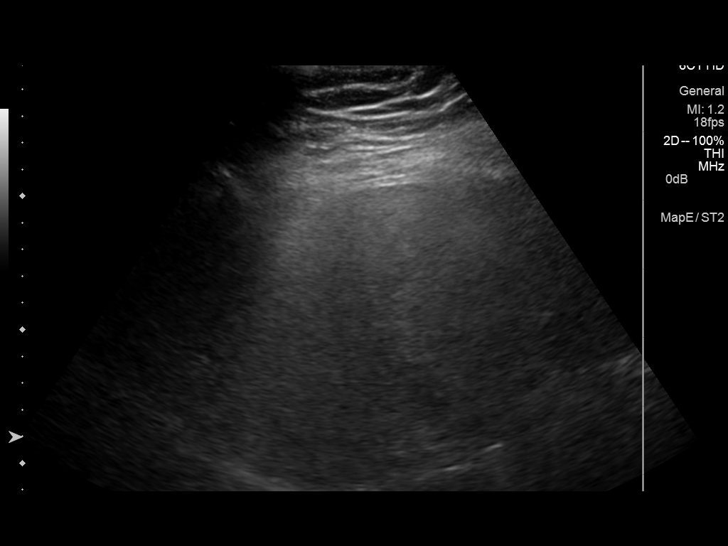
[im 57/57]
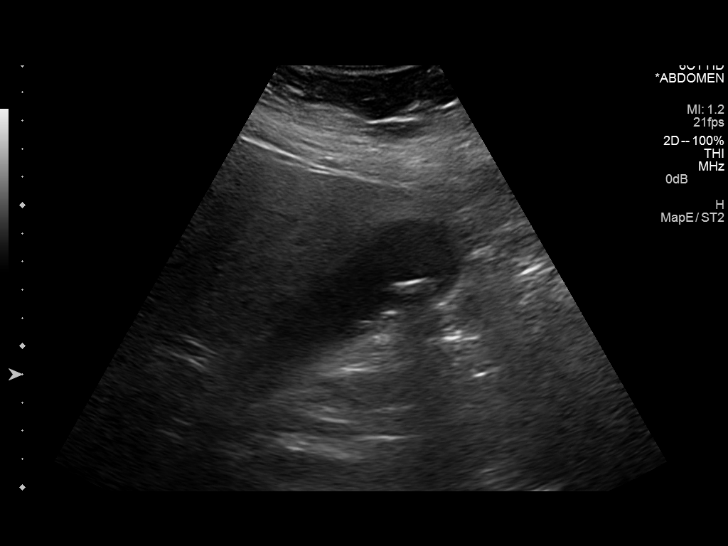

[14 of 25 positions shown; findings below may reference images not displayed]

FINDINGS: Gallbladder:

Mobile echogenic shadowing foci consistent with cholelithiasis. No
evidence of pericholecystic fluid or gallbladder wall thickening.
Per the sonographer, sonographic Murphy sign is negative.

Common bile duct:

Diameter: Within normal limits at 5 mm.

Liver:

Diffusely echogenic hepatic parenchyma with coarsening of the
echotexture. Findings are suggestive of hepatic steatosis. Main
portal vein is patent with normal hepatopetal flow.
IMPRESSION: 1. Cholelithiasis without secondary sonographic findings to suggest
acute cholecystitis.
2. Hepatic steatosis.

## 2016-06-24 IMAGING — US US ABDOMEN LIMITED
1 series · 14 of 25 positions shown · non-contrast
Comparison: None.

CLINICAL DATA: Epigastric pain.

EXAM:
US ABDOMEN LIMITED - RIGHT UPPER QUADRANT

[Series 1: us abdomen limited · 0.28mm/px · 14 of 25 slices shown]
[im 1/25]
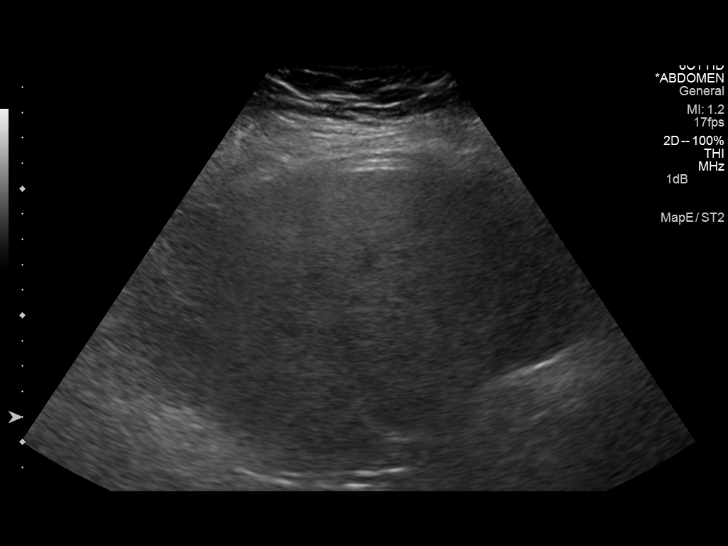
[im 3/25]
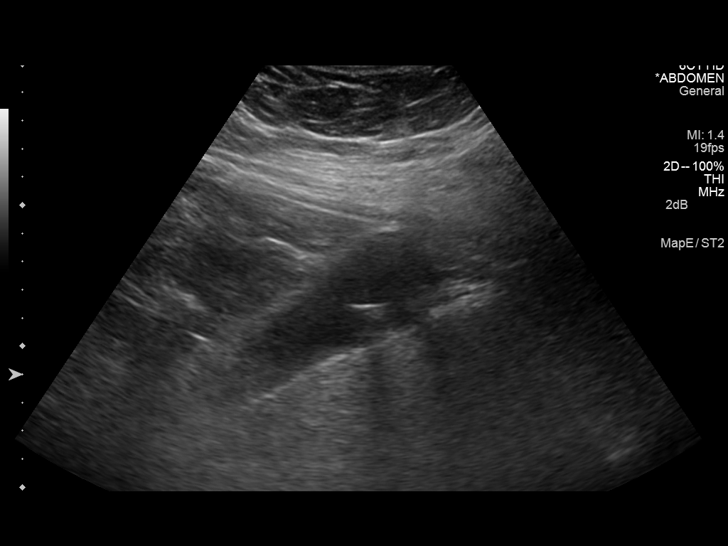
[im 5/25]
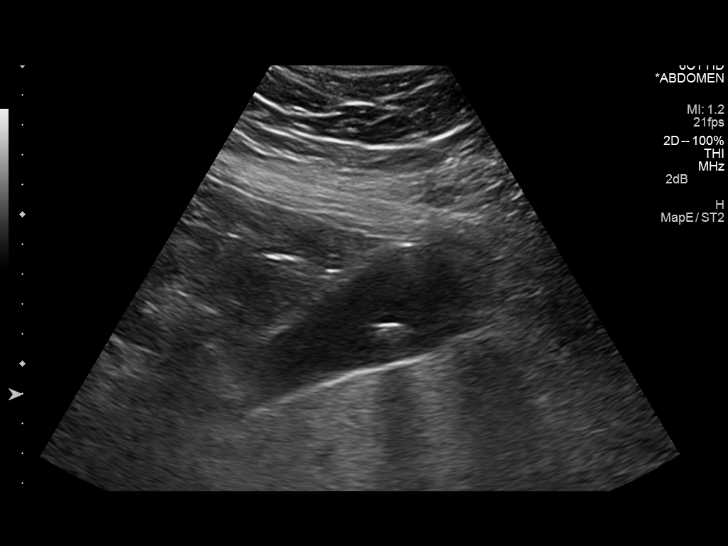
[im 7/25]
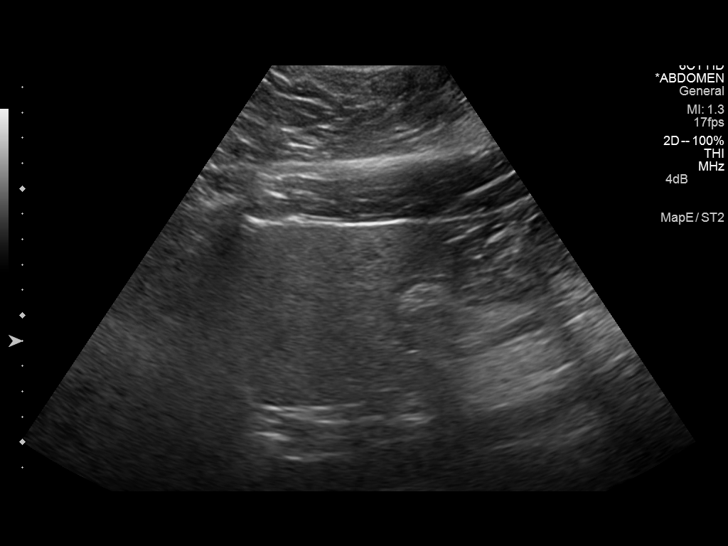
[im 9/25]
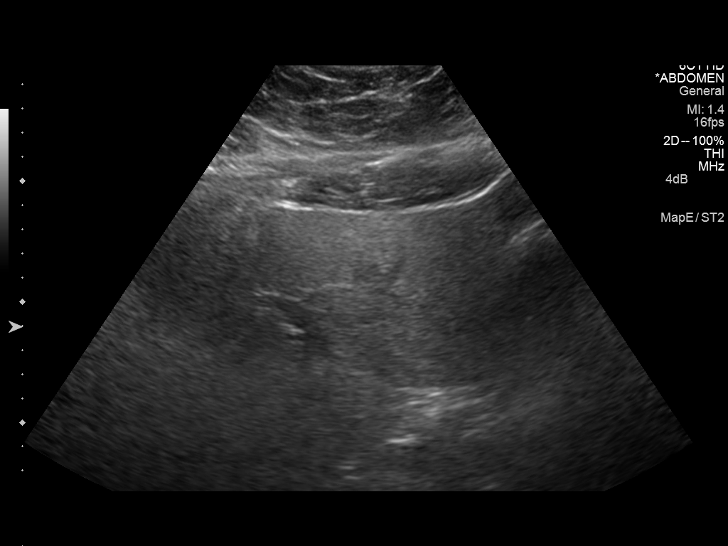
[im 10/25]
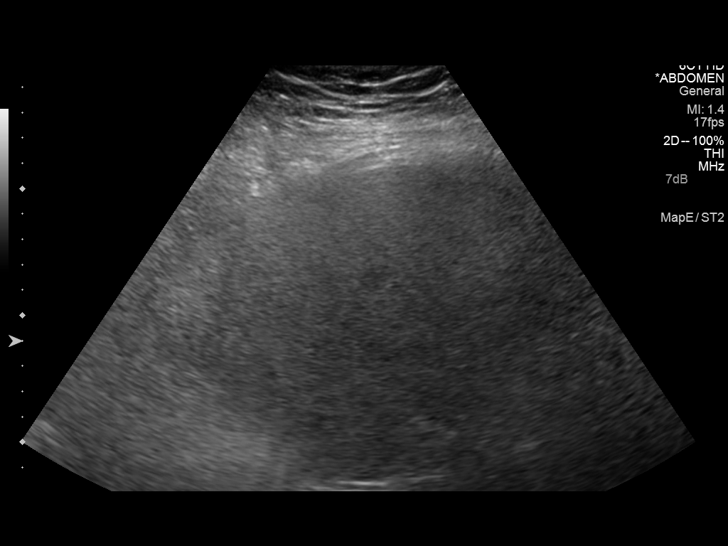
[im 12/25]
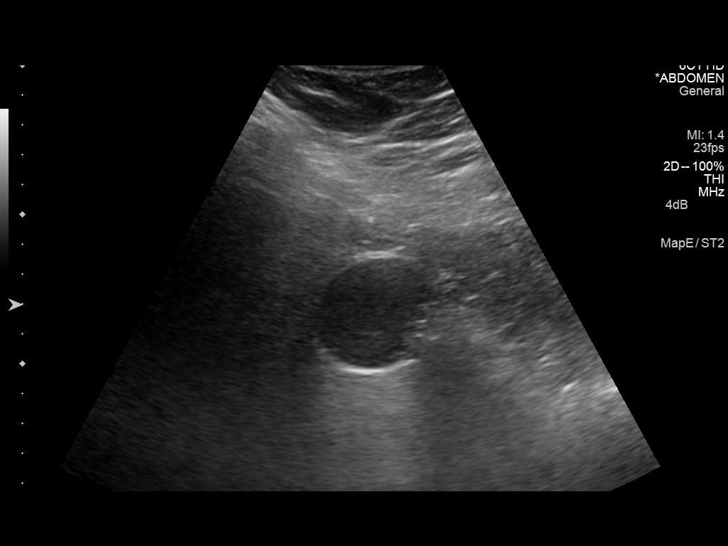
[im 14/25]
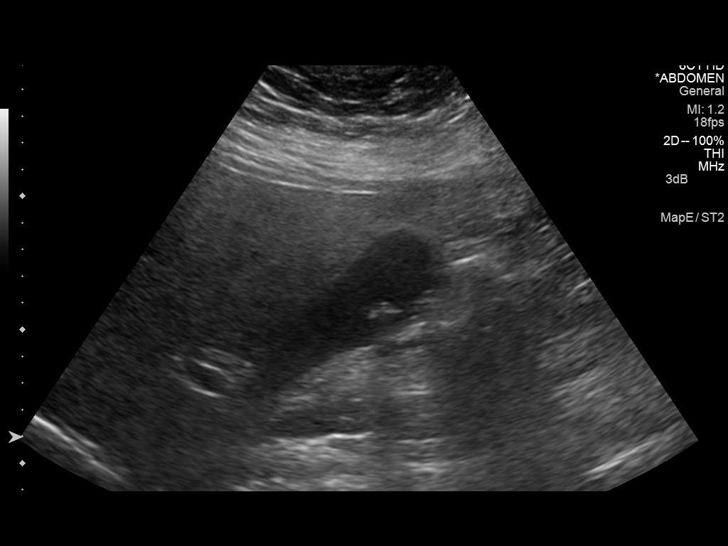
[im 16/25]
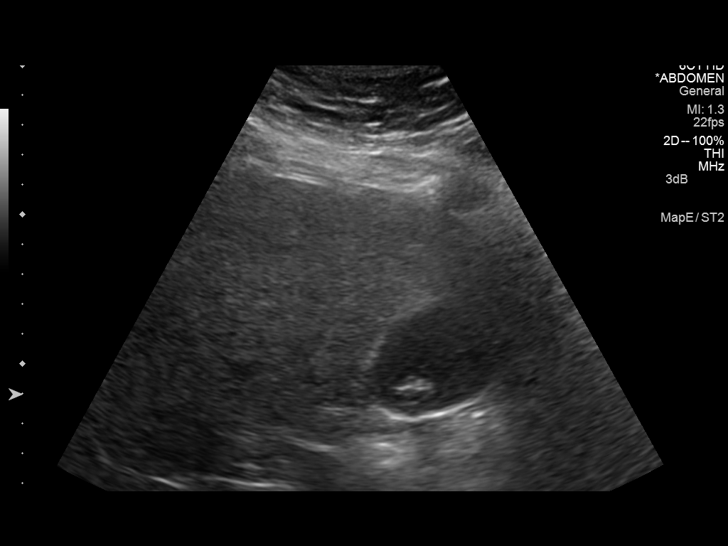
[im 17/25]
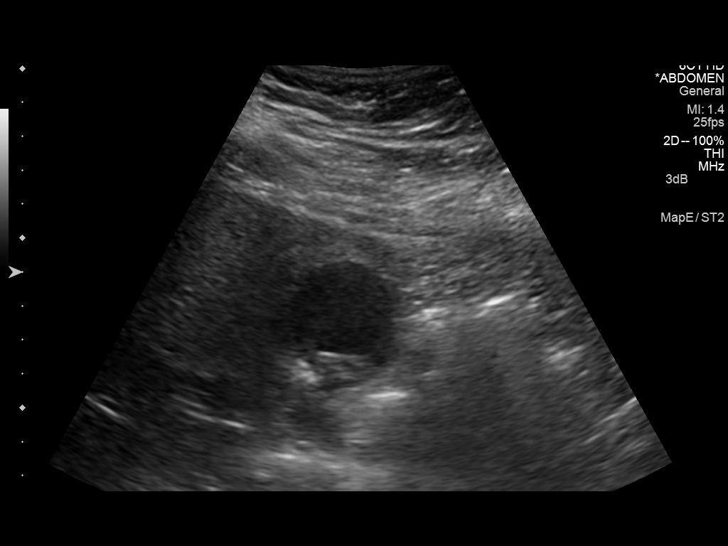
[im 19/25]
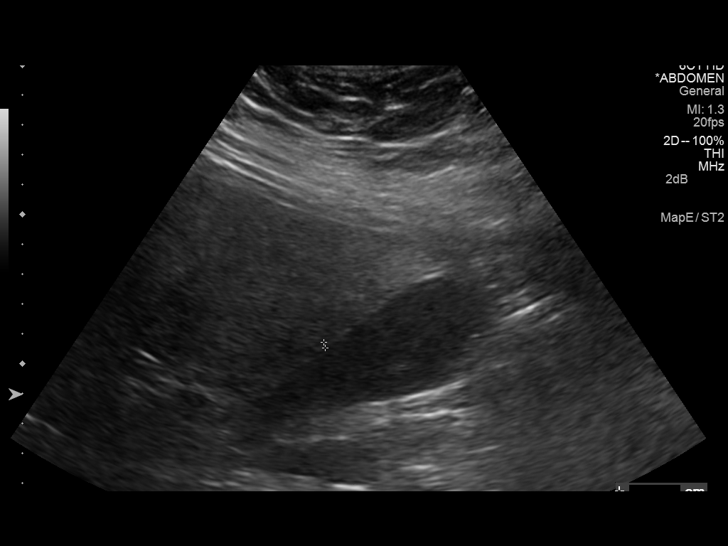
[im 21/25]
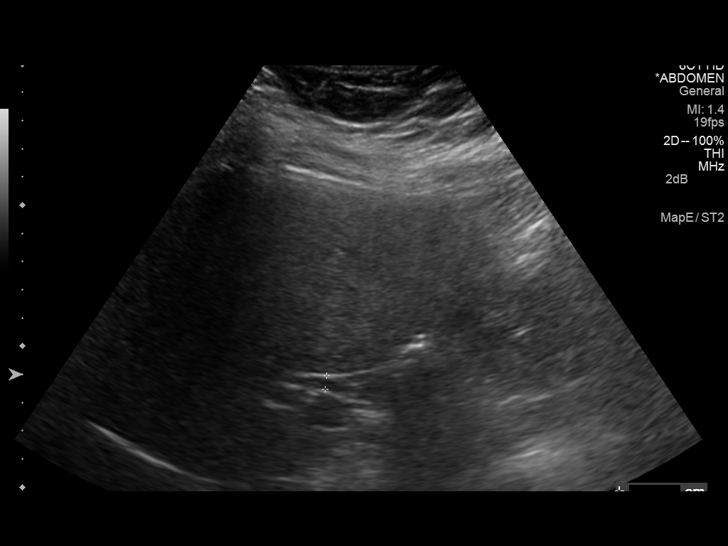
[im 23/25]
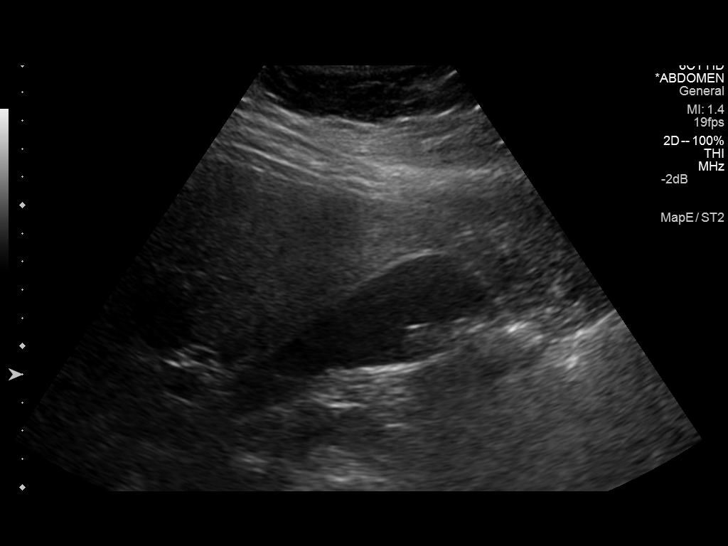
[im 25/25]
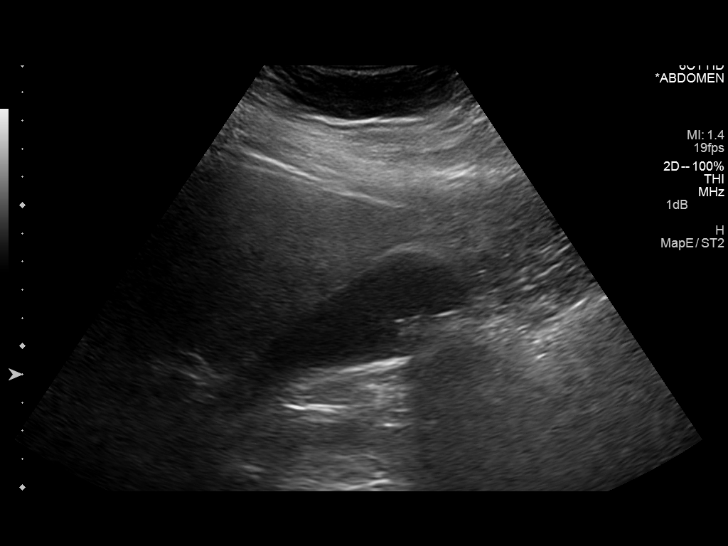

[14 of 25 positions shown; findings below may reference images not displayed]

FINDINGS: Gallbladder:

Evidence of mild cholelithiasis with the largest stone measuring
cm. No evidence of gallbladder wall thickening. Negative sonographic
Murphy sign. No pericholecystic fluid.

Common bile duct:

Diameter: 5 mm.

Liver:

Diffuse hepatic steatosis without focal mass. Normal direction of
flow within the portal vein.
IMPRESSION: Mild cholelithiasis without additional sonographic evidence to
suggest cholecystitis.

Hepatic steatosis.
